# Patient Record
Sex: Female | Born: 1960
Health system: Southern US, Community
[De-identification: ages and names within clinical notes are randomized; demographics above are authoritative.]

## PROBLEM LIST (undated history)

## (undated) DIAGNOSIS — G43909 Migraine, unspecified, not intractable, without status migrainosus: Secondary | ICD-10-CM

## (undated) DIAGNOSIS — S2239XA Fracture of one rib, unspecified side, initial encounter for closed fracture: Secondary | ICD-10-CM

## (undated) HISTORY — PX: WRIST SURGERY: SHX841

## (undated) HISTORY — PX: OTHER SURGICAL HISTORY: SHX169

---

## 2007-10-04 ENCOUNTER — Encounter: Admission: RE | Admit: 2007-10-04 | Discharge: 2007-10-04 | Payer: Self-pay | Admitting: Obstetrics and Gynecology

## 2007-10-05 ENCOUNTER — Ambulatory Visit (HOSPITAL_COMMUNITY): Admission: RE | Admit: 2007-10-05 | Discharge: 2007-10-05 | Payer: Self-pay | Admitting: Obstetrics and Gynecology

## 2008-02-29 ENCOUNTER — Ambulatory Visit (HOSPITAL_COMMUNITY): Admission: RE | Admit: 2008-02-29 | Discharge: 2008-02-29 | Payer: Self-pay | Admitting: Obstetrics and Gynecology

## 2008-11-04 ENCOUNTER — Encounter: Admission: RE | Admit: 2008-11-04 | Discharge: 2008-11-04 | Payer: Self-pay | Admitting: Obstetrics and Gynecology

## 2009-11-18 ENCOUNTER — Encounter: Admission: RE | Admit: 2009-11-18 | Discharge: 2009-11-18 | Payer: Self-pay | Admitting: Obstetrics and Gynecology

## 2010-05-16 ENCOUNTER — Encounter: Payer: Self-pay | Admitting: Obstetrics and Gynecology

## 2012-06-26 ENCOUNTER — Other Ambulatory Visit: Payer: Self-pay

## 2012-06-26 DIAGNOSIS — Z1231 Encounter for screening mammogram for malignant neoplasm of breast: Secondary | ICD-10-CM

## 2012-07-19 ENCOUNTER — Ambulatory Visit: Payer: Self-pay

## 2013-07-22 ENCOUNTER — Other Ambulatory Visit: Payer: Self-pay

## 2013-07-22 DIAGNOSIS — Z1231 Encounter for screening mammogram for malignant neoplasm of breast: Secondary | ICD-10-CM

## 2013-08-02 ENCOUNTER — Ambulatory Visit
Admission: RE | Admit: 2013-08-02 | Discharge: 2013-08-02 | Disposition: A | Discharge status: Home / Self Care | Payer: 59 | Source: Ambulatory Visit

## 2013-08-02 DIAGNOSIS — Z1231 Encounter for screening mammogram for malignant neoplasm of breast: Secondary | ICD-10-CM

## 2015-05-10 ENCOUNTER — Emergency Department (HOSPITAL_BASED_OUTPATIENT_CLINIC_OR_DEPARTMENT_OTHER)
Admission: EM | Admit: 2015-05-10 | Discharge: 2015-05-10 | Disposition: A | Discharge status: Home / Self Care | Payer: 59 | Attending: Emergency Medicine | Admitting: Emergency Medicine

## 2015-05-10 ENCOUNTER — Encounter (HOSPITAL_BASED_OUTPATIENT_CLINIC_OR_DEPARTMENT_OTHER): Payer: Self-pay | Admitting: Emergency Medicine

## 2015-05-10 DIAGNOSIS — Y92410 Unspecified street and highway as the place of occurrence of the external cause: Secondary | ICD-10-CM | POA: Diagnosis not present

## 2015-05-10 DIAGNOSIS — Y9355 Activity, bike riding: Secondary | ICD-10-CM | POA: Insufficient documentation

## 2015-05-10 DIAGNOSIS — M7918 Myalgia, other site: Secondary | ICD-10-CM

## 2015-05-10 DIAGNOSIS — Z8709 Personal history of other diseases of the respiratory system: Secondary | ICD-10-CM | POA: Insufficient documentation

## 2015-05-10 DIAGNOSIS — Y998 Other external cause status: Secondary | ICD-10-CM | POA: Diagnosis not present

## 2015-05-10 DIAGNOSIS — S29002A Unspecified injury of muscle and tendon of back wall of thorax, initial encounter: Secondary | ICD-10-CM | POA: Diagnosis not present

## 2015-05-10 HISTORY — DX: Fracture of one rib, unspecified side, initial encounter for closed fracture: S22.39XA

## 2015-05-10 MED ORDER — CYCLOBENZAPRINE HCL 10 MG PO TABS: 10.0000 mg | ORAL_TABLET | Freq: Two times a day (BID) | ORAL | Status: DC | PRN

## 2015-05-10 MED ORDER — OXYCODONE-ACETAMINOPHEN 5-325 MG PO TABS: 2.0000 | ORAL_TABLET | ORAL | Status: DC | PRN

## 2015-05-10 MED ORDER — NAPROXEN 500 MG PO TABS: 500.0000 mg | ORAL_TABLET | Freq: Two times a day (BID) | ORAL | Status: DC
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
## 2015-05-10 NOTE — ED Notes (Signed)
DC instructions reviewed with pt, discussed safety while taking PO pain meds and muscle relaxants. Also discussed non-pharmacological pain management as well. Discussed when may need to return for follow up care such as pain not being relieved by meds. Opportunity for questions provided. Teach Back Method used

## 2015-05-10 NOTE — ED Notes (Signed)
Pain is isolated to lower mid back with no radiation
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
## 2015-05-10 NOTE — ED Notes (Signed)
MD at bedside. 

## 2015-08-18 ENCOUNTER — Emergency Department (HOSPITAL_BASED_OUTPATIENT_CLINIC_OR_DEPARTMENT_OTHER)
Admission: EM | Admit: 2015-08-18 | Discharge: 2015-08-18 | Disposition: A | Discharge status: Home / Self Care | Payer: 59 | Attending: Emergency Medicine | Admitting: Emergency Medicine

## 2015-08-18 ENCOUNTER — Emergency Department (HOSPITAL_BASED_OUTPATIENT_CLINIC_OR_DEPARTMENT_OTHER): Payer: 59

## 2015-08-18 ENCOUNTER — Encounter (HOSPITAL_BASED_OUTPATIENT_CLINIC_OR_DEPARTMENT_OTHER): Payer: Self-pay | Admitting: *Deleted

## 2015-08-18 DIAGNOSIS — M25511 Pain in right shoulder: Secondary | ICD-10-CM | POA: Diagnosis not present

## 2015-08-18 DIAGNOSIS — Z8781 Personal history of (healed) traumatic fracture: Secondary | ICD-10-CM | POA: Diagnosis not present

## 2015-08-18 DIAGNOSIS — G43909 Migraine, unspecified, not intractable, without status migrainosus: Secondary | ICD-10-CM | POA: Insufficient documentation

## 2015-08-18 DIAGNOSIS — R202 Paresthesia of skin: Secondary | ICD-10-CM | POA: Insufficient documentation

## 2015-08-18 DIAGNOSIS — Z791 Long term (current) use of non-steroidal anti-inflammatories (NSAID): Secondary | ICD-10-CM | POA: Diagnosis not present

## 2015-08-18 DIAGNOSIS — N611 Abscess of the breast and nipple: Secondary | ICD-10-CM | POA: Diagnosis present

## 2015-08-18 HISTORY — DX: Migraine, unspecified, not intractable, without status migrainosus: G43.909

## 2015-08-18 MED ORDER — MELOXICAM 7.5 MG PO TABS: 7.5000 mg | ORAL_TABLET | Freq: Every day | ORAL | Status: DC

## 2015-08-18 MED FILL — MELOXICAM 7.5 MG TABLET: 7.5 | 14 days supply | Qty: 14 | Fill #0

## 2015-08-18 NOTE — Discharge Instructions (Signed)

## 2015-08-18 NOTE — ED Provider Notes (Signed)
CSN: [id number]     Arrival date & time 08/18/15  [id number] History   First MD Initiated Contact with Patient 08/18/15 0920     Chief Complaint  Patient presents with  . Arm Pain  . Breast Mass     (Consider location/radiation/quality/duration/timing/severity/associated sxs/prior Treatment) Patient is a 55 y.o. female presenting with arm pain. The history is provided by the patient. No language interpreter was used.  Arm Pain This is a new problem. The current episode started more than 1 year ago. The problem occurs constantly. The problem has been unchanged. Associated symptoms include joint swelling and myalgias. Nothing aggravates the symptoms. She has tried nothing for the symptoms. The treatment provided moderate relief.   Pt complains of tingling to right shoulder.  Pt reports pain with repetive movement.  Pt reports worse last week at work after lifting heavy objects.  Past Medical History  Diagnosis Date  . Rib fracture   . Migraine    Past Surgical History  Procedure Laterality Date  . Wrist surgery    . Chest tubes - r/t punctured lung - 2014     No family history on file. Social History  Substance Use Topics  . Smoking status: Never Smoker   . Smokeless tobacco: None  . Alcohol Use: Yes     Comment: occ   OB History    No data available     Review of Systems  Musculoskeletal: Positive for myalgias and joint swelling.  All other systems reviewed and are negative.     Allergies  Advil  Home Medications   Prior to Admission medications   Medication Sig Start Date End Date Taking? Authorizing Provider  Rizatriptan Benzoate (MAXALT PO) Take by mouth.   Yes Historical Provider, MD  cyclobenzaprine (FLEXERIL) 10 MG tablet Take 1 tablet (10 mg total) by mouth 2 (two) times daily as needed for muscle spasms. 05/10/15   Hanna Patel-Mills, PA-C  naproxen (NAPROSYN) 500 MG tablet Take 1 tablet (500 mg total) by mouth 2 (two) times daily. 05/10/15   Hanna Patel-Mills,  PA-C  oxyCODONE-acetaminophen (PERCOCET/ROXICET) 5-325 MG tablet Take 2 tablets by mouth every 4 (four) hours as needed for severe pain. 05/10/15   Hanna Patel-Mills, PA-C   BP 112/92 mmHg  Pulse 78  Temp(Src) 98.3 F (36.8 C) (Oral)  Resp 20  Ht 5\' 7"  (1.702 m)  Wt 88.451 kg  BMI 30.53 kg/m2  SpO2 100%  LMP 07/31/2015 Physical Exam  Constitutional: She is oriented to person, place, and time. She appears well-developed and well-nourished.  HENT:  Head: Normocephalic.  Eyes: EOM are normal. Pupils are equal, round, and reactive to light.  Neck: Normal range of motion.  Cardiovascular: Normal rate.   Pulmonary/Chest: Effort normal.  Abdominal: She exhibits no distension.  Musculoskeletal:  Deformed rightr clavicle.  1cm red area right breast. Looks like pimple, early small pimple,  nv and ns intact  Neurological: She is alert and oriented to person, place, and time.  Psychiatric: She has a normal mood and affect.  Nursing note and vitals reviewed.   ED Course  Procedures (including critical care time) Labs Review Labs Reviewed - No data to display  Imaging Review Dg Shoulder Right  08/18/2015  CLINICAL DATA:  Right shoulder pain for 1 week, no known injury. EXAM: RIGHT SHOULDER - 2+ VIEW COMPARISON:  None. FINDINGS: Three views of the right shoulder submitted. No acute fracture or subluxation. Moderate degenerative changes AC joint. Glenohumeral joint is preserved. Minimal spurring of  inferior glenoid. Old right upper rib fractures. Question old fracture of inferior aspect of the scapula. IMPRESSION: No acute fracture or subluxation. Degenerative changes AC joint. Minimal spurring of inferior glenoid. Old appearing right upper rib fractures. Electronically Signed   By: Natasha Mead M.D.   On: 08/18/2015 09:59   I have personally reviewed and evaluated these images and lab results as part of my medical decision-making.   EKG Interpretation None      MDM   Final diagnoses:   Abscess of right breast  Shoulder pain, right    An After Visit Summary was printed and given to the patient.  Meds ordered this encounter  Medications  . Rizatriptan Benzoate (MAXALT PO)    Sig: Take by mouth.  . meloxicam (MOBIC) 7.5 MG tablet    Sig: Take 1 tablet (7.5 mg total) by mouth daily.    Dispense:  14 tablet    Refill:  0    Order Specific Question:  Supervising Provider    Answer:  Eber Hong [[id number]]    Lonia Skinner [place], PA-C 08/18/15 1027  Gwyneth Sprout, MD 08/18/15 1420
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
## 2015-08-18 NOTE — ED Notes (Signed)
Patient ambulates to and from radiology department. 

## 2016-01-10 ENCOUNTER — Emergency Department (HOSPITAL_BASED_OUTPATIENT_CLINIC_OR_DEPARTMENT_OTHER)
Admission: EM | Admit: 2016-01-10 | Discharge: 2016-01-10 | Disposition: A | Discharge status: Home / Self Care | Payer: 59 | Attending: Emergency Medicine | Admitting: Emergency Medicine

## 2016-01-10 ENCOUNTER — Encounter (HOSPITAL_BASED_OUTPATIENT_CLINIC_OR_DEPARTMENT_OTHER): Payer: Self-pay | Admitting: *Deleted

## 2016-01-10 DIAGNOSIS — R112 Nausea with vomiting, unspecified: Secondary | ICD-10-CM | POA: Diagnosis not present

## 2016-01-10 DIAGNOSIS — R109 Unspecified abdominal pain: Secondary | ICD-10-CM | POA: Diagnosis present

## 2016-01-10 DIAGNOSIS — R1084 Generalized abdominal pain: Secondary | ICD-10-CM | POA: Diagnosis not present

## 2016-01-10 LAB — CBC WITH DIFFERENTIAL/PLATELET
BASOS PCT: 0 %
Basophils Absolute: 0 10*3/uL (ref 0.0–0.1)
Eosinophils Absolute: 0 10*3/uL (ref 0.0–0.7)
Eosinophils Relative: 0 %
HEMATOCRIT: 34.8 % — AB (ref 36.0–46.0)
Hemoglobin: 11.3 g/dL — ABNORMAL LOW (ref 12.0–15.0)
LYMPHS PCT: 11 %
Lymphs Abs: 1.6 10*3/uL (ref 0.7–4.0)
MCH: 26.4 pg (ref 26.0–34.0)
MCHC: 32.5 g/dL (ref 30.0–36.0)
MCV: 81.3 fL (ref 78.0–100.0)
MONO ABS: 1 10*3/uL (ref 0.1–1.0)
MONOS PCT: 7 %
NEUTROS ABS: 11.9 10*3/uL — AB (ref 1.7–7.7)
Neutrophils Relative %: 82 %
Platelets: 436 10*3/uL — ABNORMAL HIGH (ref 150–400)
RBC: 4.28 MIL/uL (ref 3.87–5.11)
RDW: 14.8 % (ref 11.5–15.5)
WBC: 14.5 10*3/uL — ABNORMAL HIGH (ref 4.0–10.5)

## 2016-01-10 LAB — URINALYSIS, ROUTINE W REFLEX MICROSCOPIC
Bilirubin Urine: NEGATIVE
GLUCOSE, UA: NEGATIVE mg/dL
Hgb urine dipstick: NEGATIVE
KETONES UR: NEGATIVE mg/dL
LEUKOCYTES UA: NEGATIVE
NITRITE: NEGATIVE
PROTEIN: NEGATIVE mg/dL
Specific Gravity, Urine: 1.013 (ref 1.005–1.030)
pH: 7.5 (ref 5.0–8.0)

## 2016-01-10 LAB — COMPREHENSIVE METABOLIC PANEL
ALT: 23 U/L (ref 14–54)
ANION GAP: 5 (ref 5–15)
AST: 20 U/L (ref 15–41)
Albumin: 3.7 g/dL (ref 3.5–5.0)
Alkaline Phosphatase: 80 U/L (ref 38–126)
BILIRUBIN TOTAL: 0.4 mg/dL (ref 0.3–1.2)
BUN: 9 mg/dL (ref 6–20)
CO2: 25 mmol/L (ref 22–32)
Calcium: 9.1 mg/dL (ref 8.9–10.3)
Chloride: 106 mmol/L (ref 101–111)
Creatinine, Ser: 0.69 mg/dL (ref 0.44–1.00)
Glucose, Bld: 108 mg/dL — ABNORMAL HIGH (ref 65–99)
POTASSIUM: 4.5 mmol/L (ref 3.5–5.1)
Sodium: 136 mmol/L (ref 135–145)
TOTAL PROTEIN: 7.8 g/dL (ref 6.5–8.1)

## 2016-01-10 LAB — LIPASE, BLOOD: LIPASE: 27 U/L (ref 11–51)

## 2016-01-10 MED ORDER — DICYCLOMINE HCL 10 MG PO CAPS
10.0000 mg | ORAL_CAPSULE | Freq: Once | ORAL | Status: AC
  Administered 2016-01-10: 10 mg via ORAL
  Filled 2016-01-10: qty 1

## 2016-01-10 MED ORDER — AMOXICILLIN-POT CLAVULANATE 875-125 MG PO TABS: 1.0000 | ORAL_TABLET | Freq: Once | ORAL | Status: DC

## 2016-01-10 MED ORDER — SODIUM CHLORIDE 0.9 % IV SOLN
1.5000 g | Freq: Once | INTRAVENOUS | Status: AC
  Administered 2016-01-10: 1.5 g via INTRAVENOUS
  Filled 2016-01-10: qty 1.5

## 2016-01-10 MED ORDER — SODIUM CHLORIDE 0.9 % IV BOLUS (SEPSIS)
1000.0000 mL | Freq: Once | INTRAVENOUS | Status: AC
  Administered 2016-01-10: 1000 mL via INTRAVENOUS

## 2016-01-10 MED ORDER — DICYCLOMINE HCL 20 MG PO TABS: 20.0000 mg | ORAL_TABLET | Freq: Two times a day (BID) | ORAL | 0 refills | Status: AC | PRN

## 2016-01-10 MED ORDER — AMOXICILLIN-POT CLAVULANATE 875-125 MG PO TABS: 1.0000 | ORAL_TABLET | Freq: Two times a day (BID) | ORAL | 0 refills | Status: AC

## 2016-01-10 MED ORDER — ONDANSETRON 4 MG PO TBDP: ORAL_TABLET | ORAL | 0 refills | Status: AC

## 2016-01-10 MED ORDER — ONDANSETRON HCL 4 MG/2ML IJ SOLN
4.0000 mg | Freq: Once | INTRAMUSCULAR | Status: AC
  Administered 2016-01-10: 4 mg via INTRAVENOUS
  Filled 2016-01-10: qty 2

## 2016-01-10 NOTE — ED Provider Notes (Signed)
[facility]   CSN: [id number] Arrival date & time: 01/10/16  [id number]     History   Chief Complaint No chief complaint on file.   HPI Pamela Porter is a 55 y.o. female.  55 year old female without significant past medical history presents to the emergency department for approximately 12 hours of abdominal pain associated with nausea and vomiting. This started while she had a wedding last night shortly after she ate relatively normal meal of green beans, red potatoes, baked chicken and fried catfish. She will episodes of nonbloody nonbilious vomiting overnight she had the urge to have a bowel movement so she took magnesium citrate and did have a bowel movement. This did not initially help however prior to arrival here her symptoms have improved. She initially had generalized abdominal pain also epigastric pain. At this time the pain seems to be more related to left lower quadrant. She's not been sick like this recently. No sick contacts she knows of. Unknown if anybody else in the wedding is ill. No fever, chills, urinary symptoms      Past Medical History:  Diagnosis Date  . Migraine   . Rib fracture     There are no active problems to display for this patient.   Past Surgical History:  Procedure Laterality Date  . chest tubes - r/t punctured lung - 2014    . WRIST SURGERY      OB History    No data available       Home Medications    Prior to Admission medications   Medication Sig Start Date End Date Taking? Authorizing Provider  amoxicillin-clavulanate (AUGMENTIN) 875-125 MG tablet Take 1 tablet by mouth 2 (two) times daily. One po bid x 7 days 01/10/16   Marily MemosJason Thaison Kolodziejski, MD  dicyclomine (BENTYL) 20 MG tablet Take 1 tablet (20 mg total) by mouth 2 (two) times daily as needed (abdominal pain). 01/10/16   Marily MemosJason Shakea Isip, MD  ondansetron (ZOFRAN ODT) 4 MG disintegrating tablet 4mg  ODT q4 hours prn nausea/vomit 01/10/16   Marily MemosJason Amarra Sawyer, MD    Family  History No family history on file.  Social History Social History  Substance Use Topics  . Smoking status: Never Smoker  . Smokeless tobacco: Never Used  . Alcohol use Yes     Comment: occ     Allergies   Advil [ibuprofen]   Review of Systems Review of Systems  All other systems reviewed and are negative.    Physical Exam Updated Vital Signs BP 118/82 (BP Location: Left Arm)   Pulse 77   Temp 99 F (37.2 C) (Oral)   Resp 18   Ht 5\' 7"  (1.702 m)   Wt 208 lb (94.3 kg)   LMP 01/07/2016 (Approximate)   SpO2 98%   BMI 32.58 kg/m   Physical Exam  Constitutional: She appears well-developed and well-nourished. No distress.  HENT:  Head: Normocephalic and atraumatic.  Eyes: Conjunctivae are normal.  Neck: Neck supple.  Cardiovascular: Normal rate and regular rhythm.   No murmur heard. Pulmonary/Chest: Effort normal and breath sounds normal. No respiratory distress.  Abdominal: Soft. There is tenderness ( Left lower quadrant.).  Musculoskeletal: She exhibits no edema.  Neurological: She is alert.  Skin: Skin is warm and dry.  Psychiatric: She has a normal mood and affect.  Nursing note and vitals reviewed.    ED Treatments / Results  Labs (all labs ordered are listed, but only abnormal results are displayed) Labs Reviewed  CBC  WITH DIFFERENTIAL/PLATELET - Abnormal; Notable for the following:       Result Value   WBC 14.5 (*)    Hemoglobin 11.3 (*)    HCT 34.8 (*)    Platelets 436 (*)    Neutro Abs 11.9 (*)    All other components within normal limits  COMPREHENSIVE METABOLIC PANEL - Abnormal; Notable for the following:    Glucose, Bld 108 (*)    All other components within normal limits  LIPASE, BLOOD  URINALYSIS, ROUTINE W REFLEX MICROSCOPIC (NOT AT [facility])    EKG  EKG Interpretation  Date/Time:  Sunday January 10 2016 08:40:16 EDT Ventricular Rate:  82 PR Interval:    QRS Duration: 98 QT Interval:  384 QTC Calculation: 449 R Axis:   18 Text  Interpretation:  Sinus rhythm Probable left atrial enlargement Low voltage, precordial leads RSR' in V1 or V2, probably normal variant No old tracing to compare Confirmed by [facility] MD, Barbara Cower [id number]) on 01/10/2016 9:29:05 AM       Radiology No results found.  Procedures Procedures (including critical care time)  Medications Ordered in ED Medications  dicyclomine (BENTYL) capsule 10 mg (10 mg Oral Given 01/10/16 0830)  ondansetron (ZOFRAN) injection 4 mg (4 mg Intravenous Given 01/10/16 0830)  sodium chloride 0.9 % bolus 1,000 mL (1,000 mLs Intravenous New Bag/Given 01/10/16 0856)  ampicillin-sulbactam (UNASYN) 1.5 g in sodium chloride 0.9 % 50 mL IVPB (0 g Intravenous Stopped 01/10/16 0908)     Initial Impression / Assessment and Plan / ED Course  I have reviewed the triage vital signs and the nursing notes.  Pertinent labs & imaging results that were available during my care of the patient were reviewed by me and considered in my medical decision making (see chart for details).  Possibly diverticulitis versus viral gastroenteritis. Patient does not want CT scan this time so I will possibly treated for diverticulitis unless she has significant lab abnormalities which require imaging to rule out complications.  Clinical Course  Comment By Time  Slight white count which is expected.  No ischemia or e/o old ischemia on ECG.  Plan for reevaluation with likely discharge on abx if tolerating PO.  Marily Memos, MD 09/17 [phone]   Tolerating PO. Abdominal pain improved, exam improved, still no peritonitis, minimal tenderness. Labs overall reassuring. Strict return precautions given.   Final Clinical Impressions(s) / ED Diagnoses   Final diagnoses:  Non-intractable vomiting with nausea, vomiting of unspecified type  Generalized abdominal pain    New Prescriptions New Prescriptions   AMOXICILLIN-CLAVULANATE (AUGMENTIN) 875-125 MG TABLET    Take 1 tablet by mouth 2 (two) times daily. One po  bid x 7 days   DICYCLOMINE (BENTYL) 20 MG TABLET    Take 1 tablet (20 mg total) by mouth 2 (two) times daily as needed (abdominal pain).   ONDANSETRON (ZOFRAN ODT) 4 MG DISINTEGRATING TABLET    4mg  ODT q4 hours prn nausea/vomit     Marily Memos, MD 01/10/16 1020

## 2016-01-10 NOTE — ED Triage Notes (Signed)
Pt states that she went to a wedding yesterday and afterwards she started having abd pain. C/o general abd pain. C/o nausea and vomiting throughout the night. States that she felt like she needed to have a bowel movement so she took a bottle of Mag Citrate. States pain is improved at present. States last BM was early this morning prior to the laxative.

## 2016-01-10 NOTE — ED Notes (Signed)
Report given to next shift.

## 2016-01-10 NOTE — ED Notes (Signed)
Pt unable to void at this time. 

## 2016-01-10 NOTE — ED Notes (Signed)
MD with pt  

## 2016-12-01 IMAGING — DX DG SHOULDER 2+V*R*
3 series · 3 of 3 positions shown · non-contrast
Comparison: None.

CLINICAL DATA: Right shoulder pain for 1 week, no known injury.

EXAM:
RIGHT SHOULDER - 2+ VIEW

[shoulder grashey]
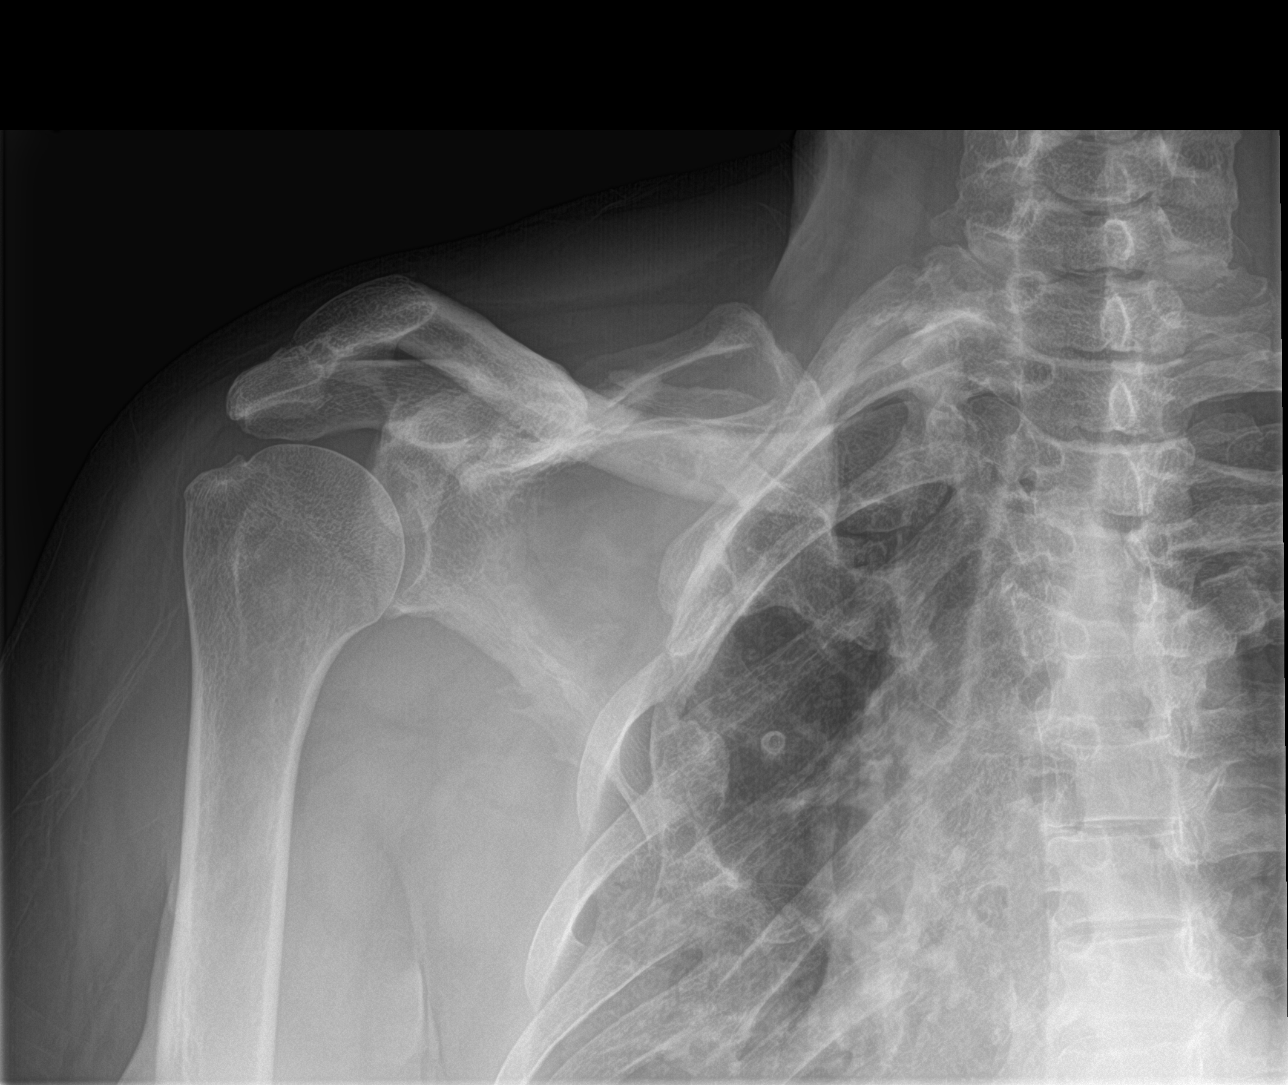

[shoulder y view]
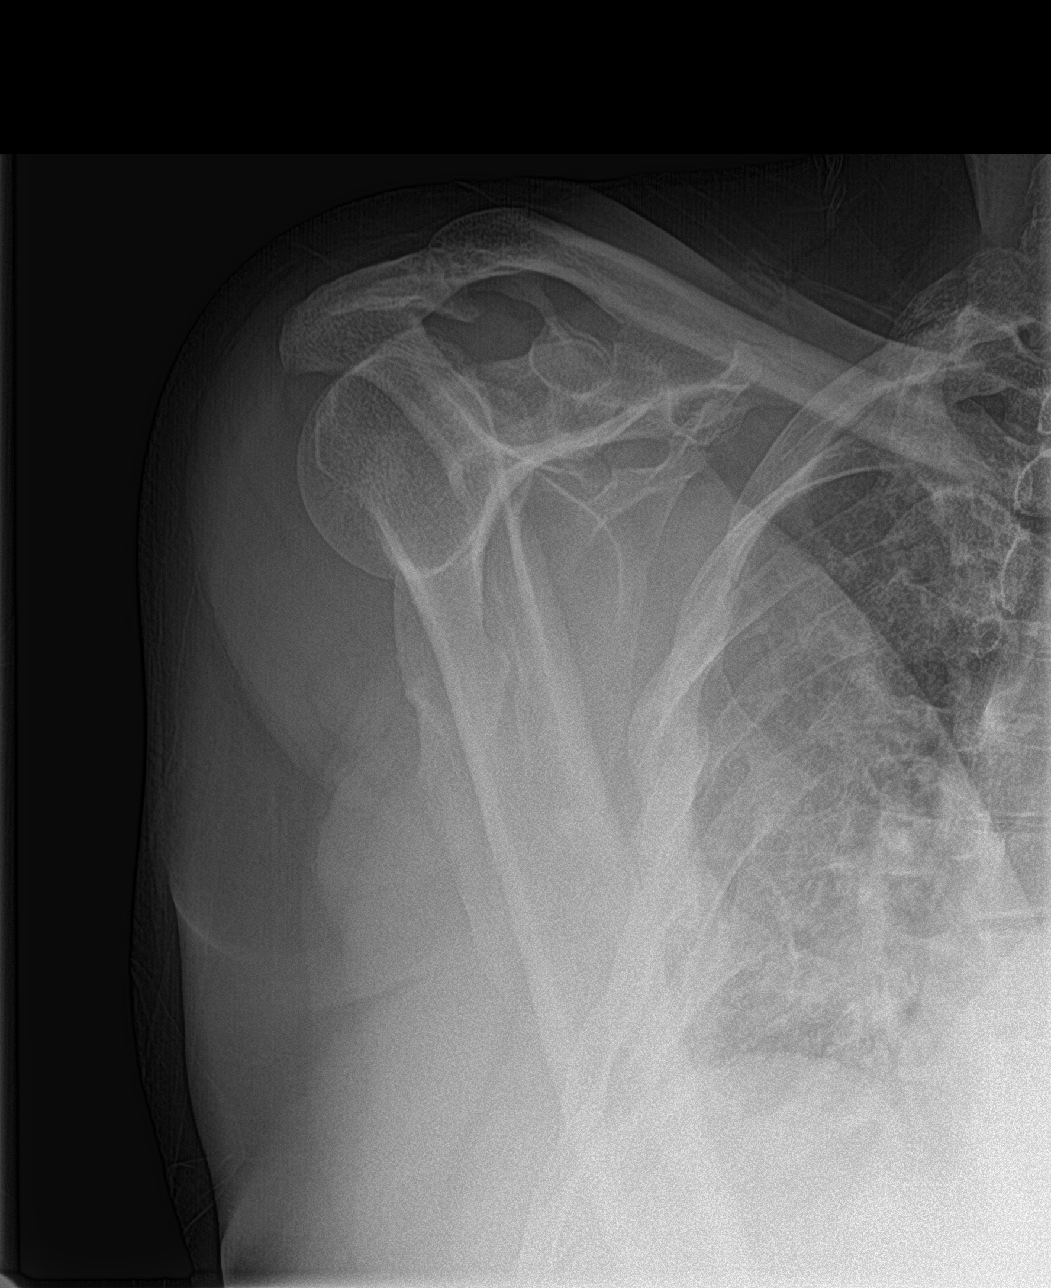

[shoulder axillary]
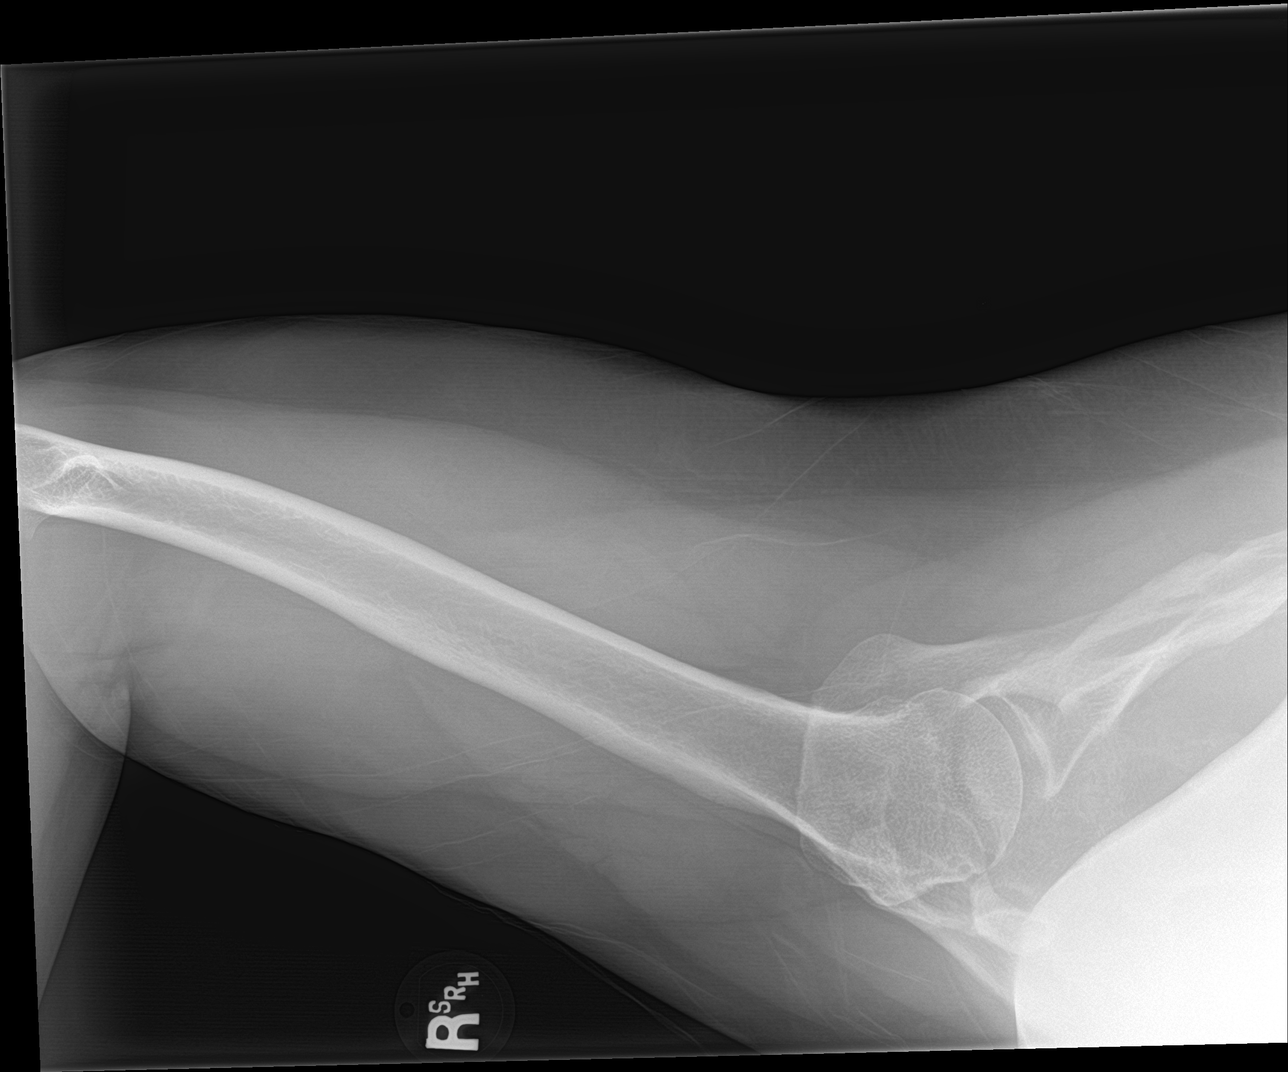

[3 of 3 positions shown; findings below may reference images not displayed]

FINDINGS: Three views of the right shoulder submitted. No acute fracture or
subluxation. Moderate degenerative changes AC joint. Glenohumeral
joint is preserved. Minimal spurring of inferior glenoid. Old right
upper rib fractures. Question old fracture of inferior aspect of the
scapula.
IMPRESSION: No acute fracture or subluxation. Degenerative changes AC joint.
Minimal spurring of inferior glenoid. Old appearing right upper rib
fractures.

## 2021-10-21 ENCOUNTER — Ambulatory Visit (INDEPENDENT_AMBULATORY_CARE_PROVIDER_SITE_OTHER): Payer: 59 | Admitting: Plastic Surgery

## 2021-10-21 VITALS — BP 122/74 | HR 87 | Temp 98.6°F | Resp 16 | Ht 67.0 in | Wt 209.0 lb

## 2021-10-21 DIAGNOSIS — R21 Rash and other nonspecific skin eruption: Secondary | ICD-10-CM | POA: Diagnosis not present

## 2021-10-21 DIAGNOSIS — N62 Hypertrophy of breast: Secondary | ICD-10-CM

## 2021-10-21 DIAGNOSIS — M546 Pain in thoracic spine: Secondary | ICD-10-CM

## 2021-10-21 DIAGNOSIS — M549 Dorsalgia, unspecified: Secondary | ICD-10-CM

## 2021-10-21 DIAGNOSIS — M4004 Postural kyphosis, thoracic region: Secondary | ICD-10-CM

## 2021-10-21 DIAGNOSIS — M542 Cervicalgia: Secondary | ICD-10-CM | POA: Diagnosis not present

## 2021-10-21 DIAGNOSIS — M545 Low back pain, unspecified: Secondary | ICD-10-CM

## 2021-10-21 NOTE — Progress Notes (Signed)
Referring Provider Maye Hides, PA [phone] [address] [place],  [place] [address]   CC:  Chief Complaint  Patient presents with   Consult      Pamela Porter is an 61 y.o. female.  HPI: Patient presents to discuss breast reduction.  She has had years of back pain, neck pain and shoulder grooving related to her large breast.  She tried over-the-counter medications, warm packs, cold packs and supportive bras with little relief.  No family history of breast cancer no previous breast biopsies or procedures or self.  She also gets rashes beneath her breast that have been refractory to over-the-counter treatments.  She has a mammogram scheduled for next week but have previously had normal mammograms.  She does not smoke and is not diabetic.  Allergies  Allergen Reactions   Advil [Ibuprofen] Swelling    Patient can take ibuprofen, but not the coated otc advil     Outpatient Encounter Medications as of 10/21/2021  Medication Sig   amoxicillin-clavulanate (AUGMENTIN) 875-125 MG tablet Take 1 tablet by mouth 2 (two) times daily. One po bid x 7 days (Patient not taking: Reported on 10/21/2021)   dicyclomine (BENTYL) 20 MG tablet Take 1 tablet (20 mg total) by mouth 2 (two) times daily as needed (abdominal pain). (Patient not taking: Reported on 10/21/2021)   ondansetron (ZOFRAN ODT) 4 MG disintegrating tablet 4mg  ODT q4 hours prn nausea/vomit (Patient not taking: Reported on 10/21/2021)   No facility-administered encounter medications on file as of 10/21/2021.     Past Medical History:  Diagnosis Date   Migraine    Rib fracture     Past Surgical History:  Procedure Laterality Date   chest tubes - r/t punctured lung - 2014     WRIST SURGERY      No family history on file.  Social History   Social History Narrative   Not on file     Review of Systems General: Denies fevers, chills, weight loss CV: Denies chest pain, shortness of breath, palpitations  Physical Exam    10/21/2021    12:26 PM 01/10/2016   10:09 AM 01/10/2016    7:44 AM  Vitals with BMI  Height 5\' 7"   5\' 7"   Weight 209 lbs  208 lbs  BMI 32.73  32.6  Systolic 122 118 01/12/2016  Diastolic 74 82 54  Pulse 87 77 87    General:  No acute distress,  Alert and oriented, Non-Toxic, Normal speech and affect Breast: She has grade 3 ptosis.  Sternal notch to nipple is 32 cm bilaterally.  Nipple to fold is 20 cm bilaterally.  No obvious scars or masses.  Assessment/Plan The patient has bilateral symptomatic macromastia.  She is a good candidate for a breast reduction.  She is interested in pursuing surgical treatment.  She has tried supportive garments and fitted bras with no relief.  The details of breast reduction surgery were discussed.  I explained the procedure in detail along the with the expected scars.  The risks were discussed in detail and include bleeding, infection, damage to surrounding structures, need for additional procedures, nipple loss, change in nipple sensation, persistent pain, contour irregularities and asymmetries.  I explained that breast feeding is often not possible after breast reduction surgery.  We discussed the expected postoperative course with an overall recovery period of about 1 month.  She demonstrated full understanding of all risks.  We discussed her personal risk factors.  The patient is interested in pursuing  surgical treatment.  I anticipate approximately 800g of tissue removed from each side.   Allena Napoleon 10/21/2021, 12:42 PM

## 2021-10-28 ENCOUNTER — Telehealth: Payer: Self-pay | Admitting: Plastic Surgery

## 2021-10-28 NOTE — Telephone Encounter (Signed)
LVM to discuss surgery dates. Requested call back.  °

## 2021-11-16 ENCOUNTER — Telehealth: Payer: Self-pay | Admitting: Surgical

## 2021-11-16 NOTE — Telephone Encounter (Signed)
Left message to return call; need to f/u on recent consult and offer new consult at N/C to patient with one of other providers.

## 2023-03-20 ENCOUNTER — Institutional Professional Consult (permissible substitution): Payer: Self-pay | Admitting: Plastic Surgery
